# Patient Record
Sex: Male | Born: 1940 | Race: White | Hispanic: No | Smoking: Current every day smoker
Health system: Southern US, Community
[De-identification: ages and names within clinical notes are randomized; demographics above are authoritative.]

## PROBLEM LIST (undated history)

## (undated) DIAGNOSIS — Z87448 Personal history of other diseases of urinary system: Secondary | ICD-10-CM

## (undated) DIAGNOSIS — Z789 Other specified health status: Secondary | ICD-10-CM

## (undated) DIAGNOSIS — I10 Essential (primary) hypertension: Secondary | ICD-10-CM

## (undated) DIAGNOSIS — N200 Calculus of kidney: Secondary | ICD-10-CM

## (undated) DIAGNOSIS — Z8781 Personal history of (healed) traumatic fracture: Secondary | ICD-10-CM

## (undated) DIAGNOSIS — G8929 Other chronic pain: Secondary | ICD-10-CM

## (undated) DIAGNOSIS — M549 Dorsalgia, unspecified: Secondary | ICD-10-CM

## (undated) DIAGNOSIS — J449 Chronic obstructive pulmonary disease, unspecified: Secondary | ICD-10-CM

## (undated) HISTORY — PX: OTHER SURGICAL HISTORY: SHX169

---

## 2002-03-23 ENCOUNTER — Ambulatory Visit (HOSPITAL_BASED_OUTPATIENT_CLINIC_OR_DEPARTMENT_OTHER): Admission: RE | Admit: 2002-03-23 | Discharge: 2002-03-23 | Payer: Self-pay | Admitting: Orthopedic Surgery

## 2013-01-04 ENCOUNTER — Emergency Department (HOSPITAL_BASED_OUTPATIENT_CLINIC_OR_DEPARTMENT_OTHER): Payer: Medicare Other

## 2013-01-04 ENCOUNTER — Emergency Department (HOSPITAL_BASED_OUTPATIENT_CLINIC_OR_DEPARTMENT_OTHER)
Admission: EM | Admit: 2013-01-04 | Discharge: 2013-01-04 | Discharge status: Against Medical Advice | Payer: Medicare Other | Attending: Emergency Medicine | Admitting: Emergency Medicine

## 2013-01-04 ENCOUNTER — Encounter (HOSPITAL_BASED_OUTPATIENT_CLINIC_OR_DEPARTMENT_OTHER): Payer: Self-pay | Admitting: *Deleted

## 2013-01-04 DIAGNOSIS — M549 Dorsalgia, unspecified: Secondary | ICD-10-CM | POA: Insufficient documentation

## 2013-01-04 DIAGNOSIS — R7401 Elevation of levels of liver transaminase levels: Secondary | ICD-10-CM | POA: Insufficient documentation

## 2013-01-04 DIAGNOSIS — F17200 Nicotine dependence, unspecified, uncomplicated: Secondary | ICD-10-CM | POA: Insufficient documentation

## 2013-01-04 DIAGNOSIS — Z8781 Personal history of (healed) traumatic fracture: Secondary | ICD-10-CM | POA: Insufficient documentation

## 2013-01-04 DIAGNOSIS — J449 Chronic obstructive pulmonary disease, unspecified: Secondary | ICD-10-CM | POA: Insufficient documentation

## 2013-01-04 DIAGNOSIS — Z87448 Personal history of other diseases of urinary system: Secondary | ICD-10-CM | POA: Insufficient documentation

## 2013-01-04 DIAGNOSIS — G8929 Other chronic pain: Secondary | ICD-10-CM | POA: Insufficient documentation

## 2013-01-04 DIAGNOSIS — I10 Essential (primary) hypertension: Secondary | ICD-10-CM | POA: Insufficient documentation

## 2013-01-04 DIAGNOSIS — R9431 Abnormal electrocardiogram [ECG] [EKG]: Secondary | ICD-10-CM

## 2013-01-04 DIAGNOSIS — R748 Abnormal levels of other serum enzymes: Secondary | ICD-10-CM

## 2013-01-04 DIAGNOSIS — R7989 Other specified abnormal findings of blood chemistry: Secondary | ICD-10-CM

## 2013-01-04 DIAGNOSIS — Z87442 Personal history of urinary calculi: Secondary | ICD-10-CM | POA: Insufficient documentation

## 2013-01-04 DIAGNOSIS — R7402 Elevation of levels of lactic acid dehydrogenase (LDH): Secondary | ICD-10-CM | POA: Insufficient documentation

## 2013-01-04 DIAGNOSIS — J4489 Other specified chronic obstructive pulmonary disease: Secondary | ICD-10-CM | POA: Insufficient documentation

## 2013-01-04 DIAGNOSIS — I959 Hypotension, unspecified: Secondary | ICD-10-CM

## 2013-01-04 HISTORY — DX: Other chronic pain: G89.29

## 2013-01-04 HISTORY — DX: Personal history of other diseases of urinary system: Z87.448

## 2013-01-04 HISTORY — DX: Personal history of (healed) traumatic fracture: Z87.81

## 2013-01-04 HISTORY — DX: Essential (primary) hypertension: I10

## 2013-01-04 HISTORY — DX: Chronic obstructive pulmonary disease, unspecified: J44.9

## 2013-01-04 HISTORY — DX: Dorsalgia, unspecified: M54.9

## 2013-01-04 HISTORY — DX: Calculus of kidney: N20.0

## 2013-01-04 HISTORY — DX: Other specified health status: Z78.9

## 2013-01-04 LAB — URINALYSIS, ROUTINE W REFLEX MICROSCOPIC
Bilirubin Urine: NEGATIVE
Hgb urine dipstick: NEGATIVE
Protein, ur: NEGATIVE mg/dL
Urobilinogen, UA: 1 mg/dL (ref 0.0–1.0)

## 2013-01-04 LAB — CBC WITH DIFFERENTIAL/PLATELET
HCT: 35.4 % — ABNORMAL LOW (ref 39.0–52.0)
Hemoglobin: 12.9 g/dL — ABNORMAL LOW (ref 13.0–17.0)
Lymphocytes Relative: 11 % — ABNORMAL LOW (ref 12–46)
Lymphs Abs: 0.7 10*3/uL (ref 0.7–4.0)
Monocytes Absolute: 0.7 10*3/uL (ref 0.1–1.0)
Monocytes Relative: 11 % (ref 3–12)
Neutro Abs: 4.8 10*3/uL (ref 1.7–7.7)
Neutrophils Relative %: 77 % (ref 43–77)
RBC: 3.51 MIL/uL — ABNORMAL LOW (ref 4.22–5.81)

## 2013-01-04 LAB — COMPREHENSIVE METABOLIC PANEL
Albumin: 3 g/dL — ABNORMAL LOW (ref 3.5–5.2)
Alkaline Phosphatase: 298 U/L — ABNORMAL HIGH (ref 39–117)
BUN: 13 mg/dL (ref 6–23)
CO2: 18 mEq/L — ABNORMAL LOW (ref 19–32)
Chloride: 107 mEq/L (ref 96–112)
GFR calc non Af Amer: 88 mL/min — ABNORMAL LOW (ref >90)
Glucose, Bld: 79 mg/dL (ref 70–99)
Potassium: 3.6 mEq/L (ref 3.5–5.1)
Total Bilirubin: 2.1 mg/dL — ABNORMAL HIGH (ref 0.3–1.2)

## 2013-01-04 LAB — POCT I-STAT 3, ART BLOOD GAS (G3+)
Acid-base deficit: 6 mmol/L — ABNORMAL HIGH (ref 0.0–2.0)
pCO2 arterial: 33 mmHg — ABNORMAL LOW (ref 35.0–45.0)
pO2, Arterial: 108 mmHg — ABNORMAL HIGH (ref 80.0–100.0)

## 2013-01-04 LAB — URINE MICROSCOPIC-ADD ON

## 2013-01-04 LAB — PROTIME-INR: Prothrombin Time: 13.2 seconds (ref 11.6–15.2)

## 2013-01-04 LAB — RAPID URINE DRUG SCREEN, HOSP PERFORMED
Opiates: NOT DETECTED
Tetrahydrocannabinol: NOT DETECTED

## 2013-01-04 LAB — LACTIC ACID, PLASMA: Lactic Acid, Venous: 4.4 mmol/L — ABNORMAL HIGH (ref 0.5–2.2)

## 2013-01-04 LAB — GLUCOSE, CAPILLARY: Glucose-Capillary: 79 mg/dL (ref 70–99)

## 2013-01-04 MED ORDER — SODIUM CHLORIDE 0.9 % IV BOLUS (SEPSIS)
1000.0000 mL | Freq: Once | INTRAVENOUS | Status: AC
  Administered 2013-01-04: 1000 mL via INTRAVENOUS

## 2013-01-04 MED ORDER — SODIUM CHLORIDE 0.9 % IV SOLN
1000.0000 mL | Freq: Once | INTRAVENOUS | Status: AC
  Administered 2013-01-04: 1000 mL via INTRAVENOUS

## 2013-01-04 MED ORDER — VANCOMYCIN HCL IN DEXTROSE 1-5 GM/200ML-% IV SOLN
INTRAVENOUS | Status: AC
  Administered 2013-01-04: 1 g
  Filled 2013-01-04: qty 200

## 2013-01-04 MED ORDER — VANCOMYCIN HCL 10 G IV SOLR
1.0000 g | Freq: Once | INTRAVENOUS | Status: DC
  Filled 2013-01-04: qty 1000

## 2013-01-04 MED ORDER — PIPERACILLIN-TAZOBACTAM 3.375 G IVPB
3.3750 g | Freq: Once | INTRAVENOUS | Status: AC
  Administered 2013-01-04: 3.375 g via INTRAVENOUS
  Filled 2013-01-04: qty 50

## 2013-01-04 NOTE — ED Notes (Signed)
Pt. Refuses to stay in hosp and does not want to be admitted.  Pt. Reports he will sign out AMA and go home.

## 2013-01-04 NOTE — ED Notes (Signed)
Pt. Reports to RN Earlene Plater he will accept the IV Abx. But he will sign out AMA when the Abx. Infusion is finished.

## 2013-01-04 NOTE — ED Notes (Signed)
Cardiac monitor NSR

## 2013-01-04 NOTE — ED Notes (Signed)
Pt to ED via EMS with hypotension BP 74/ . On arrival he is alert oriented, warm and dry. Cardiac monitor NSR. Oxygen 3L/M on arrival. Changed to 1.5 liters on arrival. Wife states at home he got up and slumped over and seemed to be unconscious. Hx of rib fracture 3 months ago.

## 2013-01-04 NOTE — ED Notes (Signed)
Pt. Able to drink H2O with no difficulty and is resting while antibiotics infuse.

## 2013-01-04 NOTE — ED Notes (Signed)
Oxygen 1.5 L/M [place]

## 2013-01-04 NOTE — ED Notes (Signed)
Unable to Link Vancomycin that was pulled by [employer].  Med was ordered by Dr. Rosalia Hammers and was acknowledged but did not show up in the Pyxis.

## 2013-01-04 NOTE — ED Provider Notes (Signed)
History     CSN: [id number]  Arrival date & time 01/04/13  1243   First MD Initiated Contact with Patient 01/04/13 1246      Chief Complaint  Patient presents with  . Hypotension    (Consider location/radiation/quality/duration/timing/severity/associated sxs/prior treatment) HPI Patient with syncopal episode this a.m. Sitting at the table.  Slumped over table and unresponsive to voice for 5 minutes.  Wife called ems and ems arrived and reported bp at 80/40.  Patient awake and alert.  He reports that he took zanaflex this a.m. And a pain pill earlier.  He denies headache, head injury, chest pain, cough, dyspnea, abdominal pain, nausea, vomiting, or diarrhea, fever, or chills.  He has chronic back pain that he took the zanaflex and vicodin for.   Past Medical History  Diagnosis Date  . COPD (chronic obstructive pulmonary disease)   . Hypertension   . Chronic back pain   . H/O bladder problems   . Kidney stones   . History of rib fracture   . Self-catheterizes urinary bladder     self caths    Past Surgical History  Procedure Laterality Date  . Bladder problem surgery      No family history on file.  History  Substance Use Topics  . Smoking status: Current Every Day Smoker -- 1.50 packs/day    Types: Cigarettes  . Smokeless tobacco: Not on file  . Alcohol Use: Yes     Comment: drinks several alcoholic drinks at night      Review of Systems  All other systems reviewed and are negative.    Allergies  Sulfa antibiotics  Home Medications   Current Outpatient Rx  Name  Route  Sig  Dispense  Refill  . Hydrocodone-Acetaminophen (VICODIN PO)   Oral   Take by mouth.         . losartan (COZAAR) 100 MG tablet   Oral   Take 100 mg by mouth daily.         . simvastatin (ZOCOR) 40 MG tablet   Oral   Take 40 mg by mouth every evening.           BP 89/50  Pulse 65  Temp(Src) 98.6 F (37 C) (Rectal)  Resp 16  SpO2 98%  Physical Exam  Nursing note and  vitals reviewed. Constitutional: He is oriented to person, place, and time. He appears well-developed and well-nourished.  HENT:  Head: Normocephalic.  Right Ear: External ear normal.  Left Ear: External ear normal.  Nose: Nose normal.  Mucous membranes dry  Eyes: Conjunctivae are normal. Pupils are equal, round, and reactive to light.  Neck: Normal range of motion. Neck supple.  Cardiovascular: Normal rate, regular rhythm and normal heart sounds.   Pulmonary/Chest: Effort normal and breath sounds normal.  Abdominal: Soft. Bowel sounds are normal.  Musculoskeletal: Normal range of motion.  Neurological: He is alert and oriented to person, place, and time. He has normal reflexes.  Skin: Skin is warm and dry.  Psychiatric: He has a normal mood and affect. His behavior is normal. Judgment and thought content normal.    ED Course  Procedures (including critical care time)  Labs Reviewed  CBC WITH DIFFERENTIAL - Abnormal; Notable for the following:    RBC 3.51 (*)    Hemoglobin 12.9 (*)    HCT 35.4 (*)    MCV 100.9 (*)    MCH 36.8 (*)    MCHC 36.4 (*)    Lymphocytes Relative 11 (*)  All other components within normal limits  COMPREHENSIVE METABOLIC PANEL - Abnormal; Notable for the following:    CO2 18 (*)    Albumin 3.0 (*)    AST 550 (*)    ALT 192 (*)    Alkaline Phosphatase 298 (*)    Total Bilirubin 2.1 (*)    GFR calc non Af Amer 88 (*)    All other components within normal limits  ETHANOL - Abnormal; Notable for the following:    Alcohol, Ethyl (B) 123 (*)    All other components within normal limits  LACTIC ACID, PLASMA - Abnormal; Notable for the following:    Lactic Acid, Venous 4.4 (*)    All other components within normal limits  POCT I-STAT 3, BLOOD GAS (G3+) - Abnormal; Notable for the following:    pCO2 arterial 33.0 (*)    pO2, Arterial 108.0 (*)    Bicarbonate 18.4 (*)    Acid-base deficit 6.0 (*)    All other components within normal limits   CULTURE, BLOOD (ROUTINE X 2)  CULTURE, BLOOD (ROUTINE X 2)  AMMONIA  PROTIME-INR  GLUCOSE, CAPILLARY  URINE RAPID DRUG SCREEN (HOSP PERFORMED)  URINALYSIS, ROUTINE W REFLEX MICROSCOPIC  TROPONIN I   No results found.   No diagnosis found.  Results for orders placed during the hospital encounter of 01/04/13  CBC WITH DIFFERENTIAL      Result Value Range   WBC 6.2  4.0 - 10.5 K/uL   RBC 3.51 (*) 4.22 - 5.81 MIL/uL   Hemoglobin 12.9 (*) 13.0 - 17.0 g/dL   HCT 62.1 (*) 30.8 - 65.7 %   MCV 100.9 (*) 78.0 - 100.0 fL   MCH 36.8 (*) 26.0 - 34.0 pg   MCHC 36.4 (*) 30.0 - 36.0 g/dL   RDW 84.6  96.2 - 95.2 %   Platelets 202  150 - 400 K/uL   Neutrophils Relative 77  43 - 77 %   Neutro Abs 4.8  1.7 - 7.7 K/uL   Lymphocytes Relative 11 (*) 12 - 46 %   Lymphs Abs 0.7  0.7 - 4.0 K/uL   Monocytes Relative 11  3 - 12 %   Monocytes Absolute 0.7  0.1 - 1.0 K/uL   Eosinophils Relative 1  0 - 5 %   Eosinophils Absolute 0.1  0.0 - 0.7 K/uL   Basophils Relative 1  0 - 1 %   Basophils Absolute 0.0  0.0 - 0.1 K/uL  AMMONIA      Result Value Range   Ammonia 45  11 - 60 umol/L  COMPREHENSIVE METABOLIC PANEL      Result Value Range   Sodium 143  135 - 145 mEq/L   Potassium 3.6  3.5 - 5.1 mEq/L   Chloride 107  96 - 112 mEq/L   CO2 18 (*) 19 - 32 mEq/L   Glucose, Bld 79  70 - 99 mg/dL   BUN 13  6 - 23 mg/dL   Creatinine, Ser 8.41  0.50 - 1.35 mg/dL   Calcium 8.9  8.4 - 32.4 mg/dL   Total Protein 6.2  6.0 - 8.3 g/dL   Albumin 3.0 (*) 3.5 - 5.2 g/dL   AST 401 (*) 0 - 37 U/L   ALT 192 (*) 0 - 53 U/L   Alkaline Phosphatase 298 (*) 39 - 117 U/L   Total Bilirubin 2.1 (*) 0.3 - 1.2 mg/dL   GFR calc non Af Amer 88 (*) >90 mL/min   GFR  calc Af Amer >90  >90 mL/min  ETHANOL      Result Value Range   Alcohol, Ethyl (B) 123 (*) 0 - 11 mg/dL  LACTIC ACID, PLASMA      Result Value Range   Lactic Acid, Venous 4.4 (*) 0.5 - 2.2 mmol/L  PROTIME-INR      Result Value Range   Prothrombin Time 13.2   11.6 - 15.2 seconds   INR 1.01  0.00 - 1.49  GLUCOSE, CAPILLARY      Result Value Range   Glucose-Capillary 79  70 - 99 mg/dL  POCT I-STAT 3, BLOOD GAS (G3+)      Result Value Range   pH, Arterial 7.354  7.350 - 7.450   pCO2 arterial 33.0 (*) 35.0 - 45.0 mmHg   pO2, Arterial 108.0 (*) 80.0 - 100.0 mmHg   Bicarbonate 18.4 (*) 20.0 - 24.0 mEq/L   TCO2 19  0 - 100 mmol/L   O2 Saturation 98.0     Acid-base deficit 6.0 (*) 0.0 - 2.0 mmol/L   Patient temperature 98.6 F     Collection site RADIAL, ALLEN'S TEST ACCEPTABLE     Drawn by Operator     Sample type ARTERIAL      Date: 01/04/2013  Rate: 66  Rhythm: nsr   QRS Axis: normal  Intervals: normal  ST/T Wave abnormalities: t wave abnormality III, avn avf, v4 and v5  Conduction Disutrbances:none  Narrative Interpretation:   Old EKG Reviewed: none available    MDM   Patient with hypotension corrected with one liter ns and second liter started.  Patient with elevated lactate, alcohol, lfts, and abnormal lactic acid.  Patient advised admission to assess for sepsis.  Zosyn and vancomycin ordered.  Patient refuses admission.  Discussed with patient severity of illness and need for hospitalization.  Patient continues to refuse.  Wife present and also states he will not stay.   CRITICAL CARE Performed by: Hilario Quarry   Total critical care time: 45  Critical care time was exclusive of separately billable procedures and treating other patients.  Critical care was necessary to treat or prevent imminent or life-threatening deterioration.  Critical care was time spent personally by me on the following activities: development of treatment plan with patient and/or surrogate as well as nursing, discussions with consultants, evaluation of patient's response to treatment, examination of patient, obtaining history from patient or surrogate, ordering and performing treatments and interventions, ordering and review of laboratory studies,  ordering and review of radiographic studies, pulse oximetry and re-evaluation of patient's condition.  Date: 01/04/2013 Patient: Bobby Dougherty Admitted: 01/04/2013 12:43 PM Attending Provider: Hilario Quarry, MD  Maricela Curet or his authorized caregiver has made the decision for the patient to leave the emergency department against the advice of Hilario Quarry, MD.  He or his authorized caregiver has been informed and understands the inherent risks, including death.  He or his authorized caregiver has decided to accept the responsibility for this decision. Maricela Curet and all necessary parties have been advised that he may return for further evaluation or treatment. His condition at time of discharge was Stable.  Maricela Curet had current vital signs as follows:  Blood pressure 121/73, pulse 65, temperature 98.6 F (37 C), temperature source Rectal, resp. rate 18, height 5\' 9"  (1.753 m), weight 160 lb (72.576 kg), SpO2 97.00%.   Maricela Curet or his authorized caregiver has signed the Leaving Against Medical Advice form prior to leaving the department.  Aleyda Gindlesperger  S 01/04/2013         Hilario Quarry, MD 01/04/13 1432

## 2013-01-06 LAB — URINE CULTURE: Colony Count: >=100000

## 2013-01-07 ENCOUNTER — Telehealth (HOSPITAL_COMMUNITY): Payer: Self-pay | Admitting: Emergency Medicine

## 2013-01-07 NOTE — ED Notes (Signed)
Patient has +Urine culture. Checking to see if appropriately treated. °

## 2013-01-07 NOTE — ED Notes (Signed)
+   Urine Chart sent to EDP office for review. 

## 2013-01-09 NOTE — ED Notes (Signed)
Wife(caretaker) informed of positive results  And she will have him f/u with PCP.

## 2013-01-09 NOTE — ED Notes (Signed)
Chart returned from EDP office with rx for Keflex 500 mg tid x 7 days needs to be called to pharmacy per Rhea Bleacher.

## 2013-01-10 LAB — CULTURE, BLOOD (ROUTINE X 2)
Culture: NO GROWTH
Culture: NO GROWTH

## 2014-03-29 DIAGNOSIS — K219 Gastro-esophageal reflux disease without esophagitis: Secondary | ICD-10-CM | POA: Insufficient documentation

## 2014-03-29 DIAGNOSIS — Z86010 Personal history of colonic polyps: Secondary | ICD-10-CM | POA: Insufficient documentation

## 2014-03-29 DIAGNOSIS — K5730 Diverticulosis of large intestine without perforation or abscess without bleeding: Secondary | ICD-10-CM | POA: Insufficient documentation

## 2014-03-29 DIAGNOSIS — M5136 Other intervertebral disc degeneration, lumbar region: Secondary | ICD-10-CM | POA: Insufficient documentation

## 2014-03-29 DIAGNOSIS — K700 Alcoholic fatty liver: Secondary | ICD-10-CM | POA: Insufficient documentation

## 2014-03-29 DIAGNOSIS — N529 Male erectile dysfunction, unspecified: Secondary | ICD-10-CM | POA: Insufficient documentation

## 2014-03-29 DIAGNOSIS — R948 Abnormal results of function studies of other organs and systems: Secondary | ICD-10-CM | POA: Insufficient documentation

## 2014-03-29 DIAGNOSIS — N200 Calculus of kidney: Secondary | ICD-10-CM | POA: Insufficient documentation

## 2014-03-29 DIAGNOSIS — M810 Age-related osteoporosis without current pathological fracture: Secondary | ICD-10-CM | POA: Insufficient documentation

## 2014-03-29 DIAGNOSIS — G4700 Insomnia, unspecified: Secondary | ICD-10-CM | POA: Insufficient documentation

## 2014-03-29 DIAGNOSIS — E785 Hyperlipidemia, unspecified: Secondary | ICD-10-CM | POA: Insufficient documentation

## 2014-03-29 DIAGNOSIS — K811 Chronic cholecystitis: Secondary | ICD-10-CM | POA: Insufficient documentation

## 2014-03-29 DIAGNOSIS — J309 Allergic rhinitis, unspecified: Secondary | ICD-10-CM | POA: Insufficient documentation

## 2014-03-29 DIAGNOSIS — N319 Neuromuscular dysfunction of bladder, unspecified: Secondary | ICD-10-CM | POA: Insufficient documentation

## 2014-03-29 DIAGNOSIS — I719 Aortic aneurysm of unspecified site, without rupture: Secondary | ICD-10-CM | POA: Insufficient documentation

## 2014-03-29 DIAGNOSIS — M5430 Sciatica, unspecified side: Secondary | ICD-10-CM | POA: Insufficient documentation

## 2014-03-29 DIAGNOSIS — R7303 Prediabetes: Secondary | ICD-10-CM | POA: Insufficient documentation

## 2014-03-29 DIAGNOSIS — J449 Chronic obstructive pulmonary disease, unspecified: Secondary | ICD-10-CM | POA: Insufficient documentation

## 2014-04-29 DIAGNOSIS — R9431 Abnormal electrocardiogram [ECG] [EKG]: Secondary | ICD-10-CM | POA: Insufficient documentation

## 2014-05-13 DIAGNOSIS — N35919 Unspecified urethral stricture, male, unspecified site: Secondary | ICD-10-CM | POA: Insufficient documentation

## 2014-06-18 DIAGNOSIS — S32010A Wedge compression fracture of first lumbar vertebra, initial encounter for closed fracture: Secondary | ICD-10-CM | POA: Insufficient documentation

## 2014-06-18 DIAGNOSIS — S22080A Wedge compression fracture of T11-T12 vertebra, initial encounter for closed fracture: Secondary | ICD-10-CM | POA: Insufficient documentation

## 2014-11-27 IMAGING — CT CT HEAD W/O CM
2 series · 16 of 30 positions shown, 18 images · non-contrast
Comparison: None.

CLINICAL DATA: Hypotension.

CT HEAD WITHOUT CONTRAST
TECHNIQUE: Contiguous axial images were obtained from the base of
the skull through the vertex without contrast.

[Series 2: head 4.8 h37s · axial · 0.49mm/px · z∈[+1420,+1557]mm · 8 of 36 slices shown, 10 images]
[im 4/36  brain]
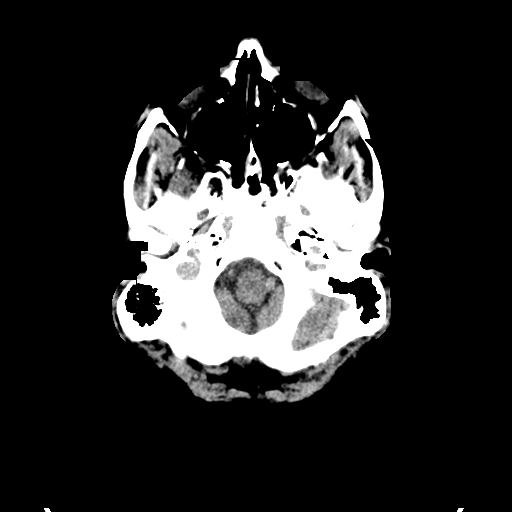
[im 4/36  bone]
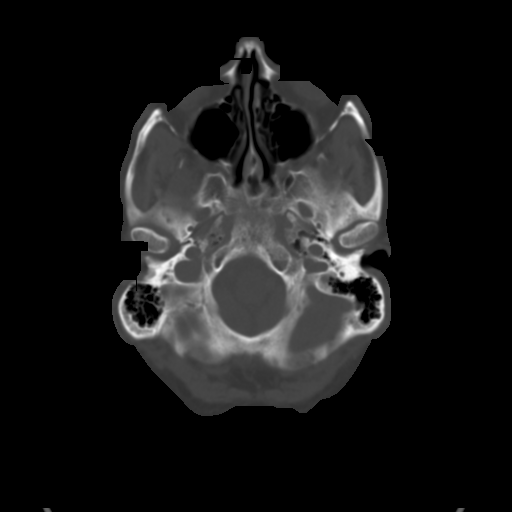
[im 8/36  brain]
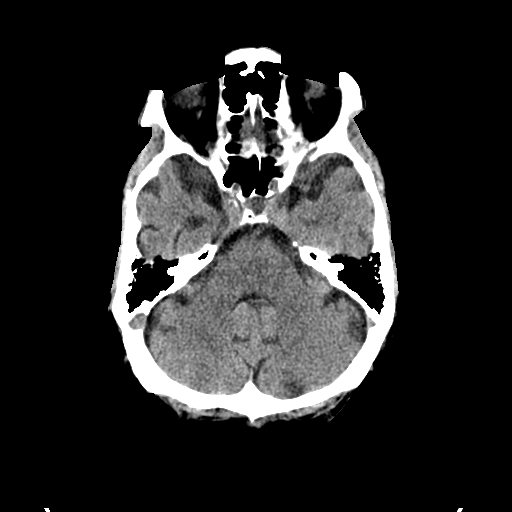
[im 12/36  brain]
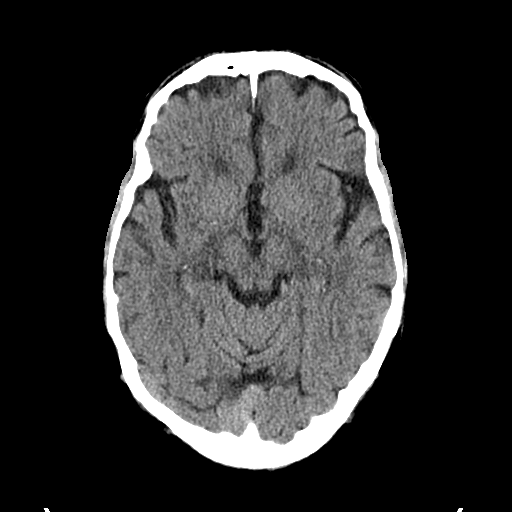
[im 16/36  brain]
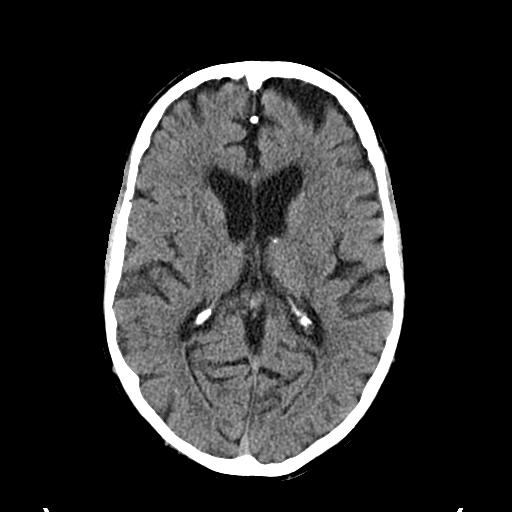
[im 20/36  brain]
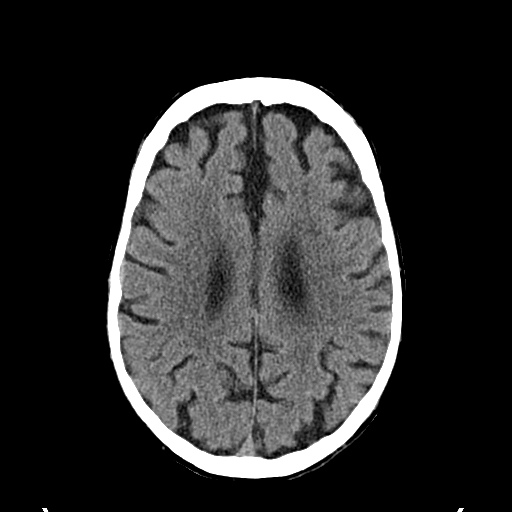
[im 20/36  bone]
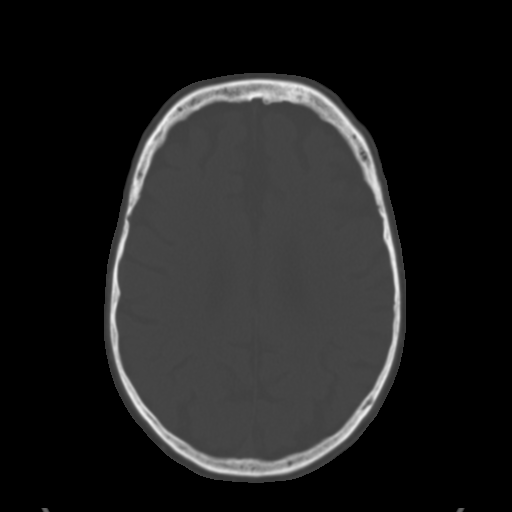
[im 24/36  brain]
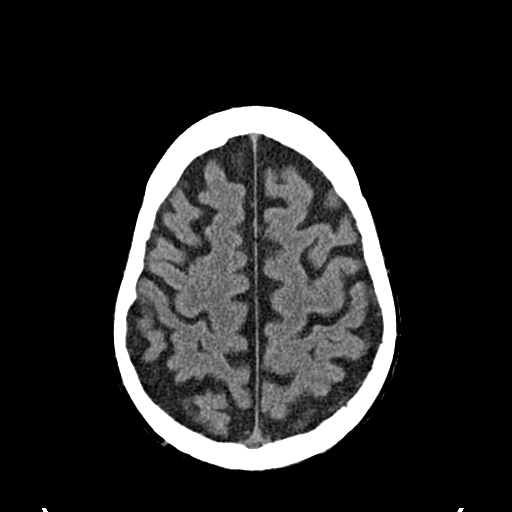
[im 28/36  brain]
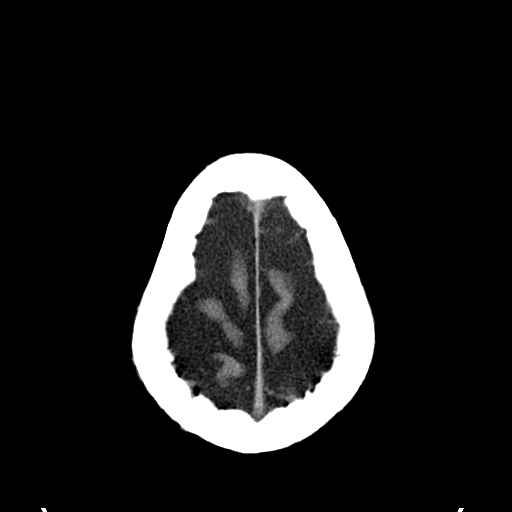
[im 32/36  brain]
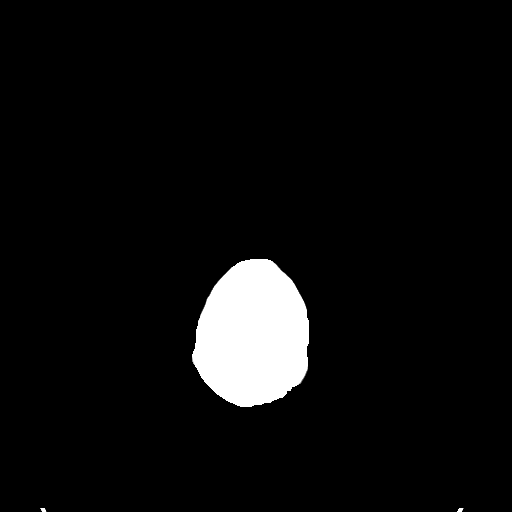

[Series 3: head 2.4 h60s bone · axial · 0.49mm/px · z∈[+1421,+1558]mm · 8 of 72 slices shown]
[im 8/72  bone]
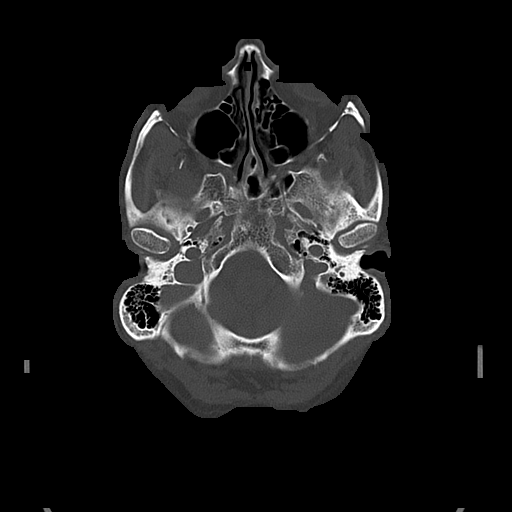
[im 15/72  bone]
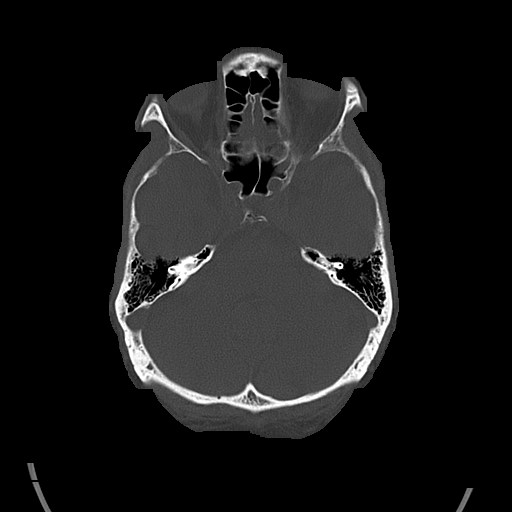
[im 23/72  bone]
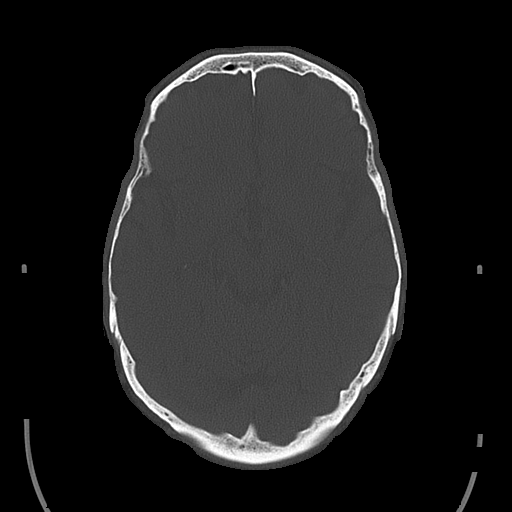
[im 30/72  bone]
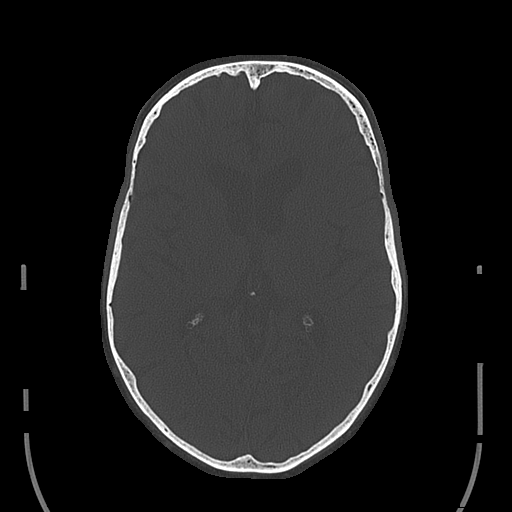
[im 42/72  bone]
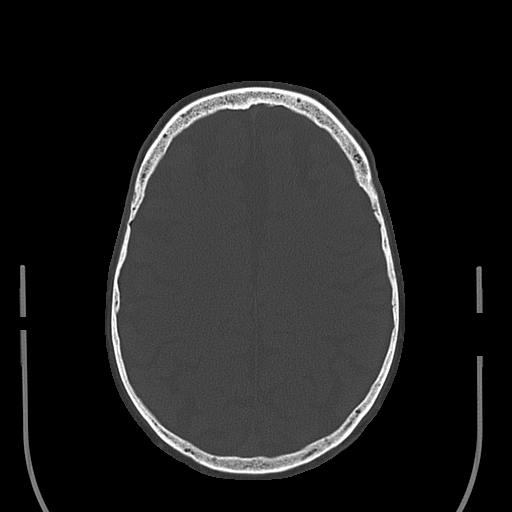
[im 49/72  bone]
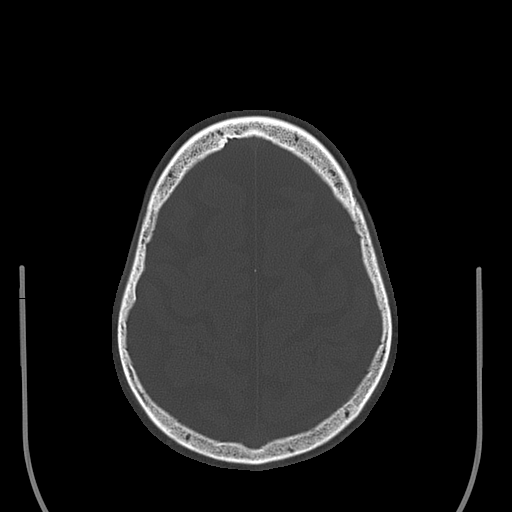
[im 57/72  bone]
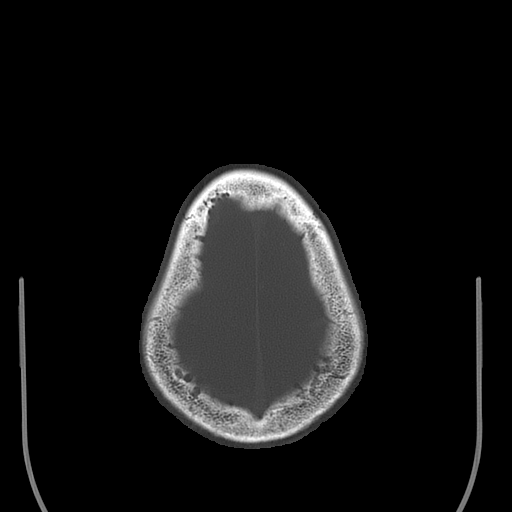
[im 64/72  bone]
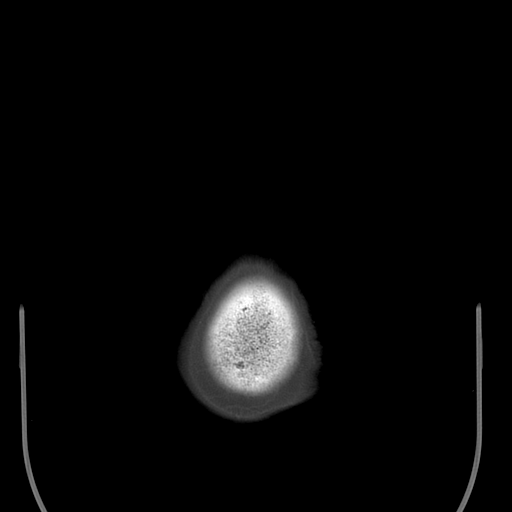

[16 of 30 positions shown; findings below may reference images not displayed]

FINDINGS: There is  low attenuation within the subcortical and
periventricular white matter consistent with chronic small vessel
ischemic change. Several small areas of low attenuation within the
right cerebellar hemisphere noted consistent with old infarcts.
There is prominence of the sulci and ventricles consistent with
brain atrophy.  Chronic bilateral basal ganglia calcifications
noted.  There is no evidence for acute brain infarct, hemorrhage or
mass.  The calvarium is intact.  The paranasal sinuses and mastoid
air cells are clear.
IMPRESSION: 1.  No acute intracranial abnormalities.
2.  Small vessel ischemic change and brain atrophy.

## 2014-11-27 IMAGING — CR DG CHEST 2V
1 series · 1 of 1 positions shown · non-contrast
Comparison: 02/10/2010

CLINICAL DATA: Hypotension

CHEST - 2 VIEW

[view not recorded]
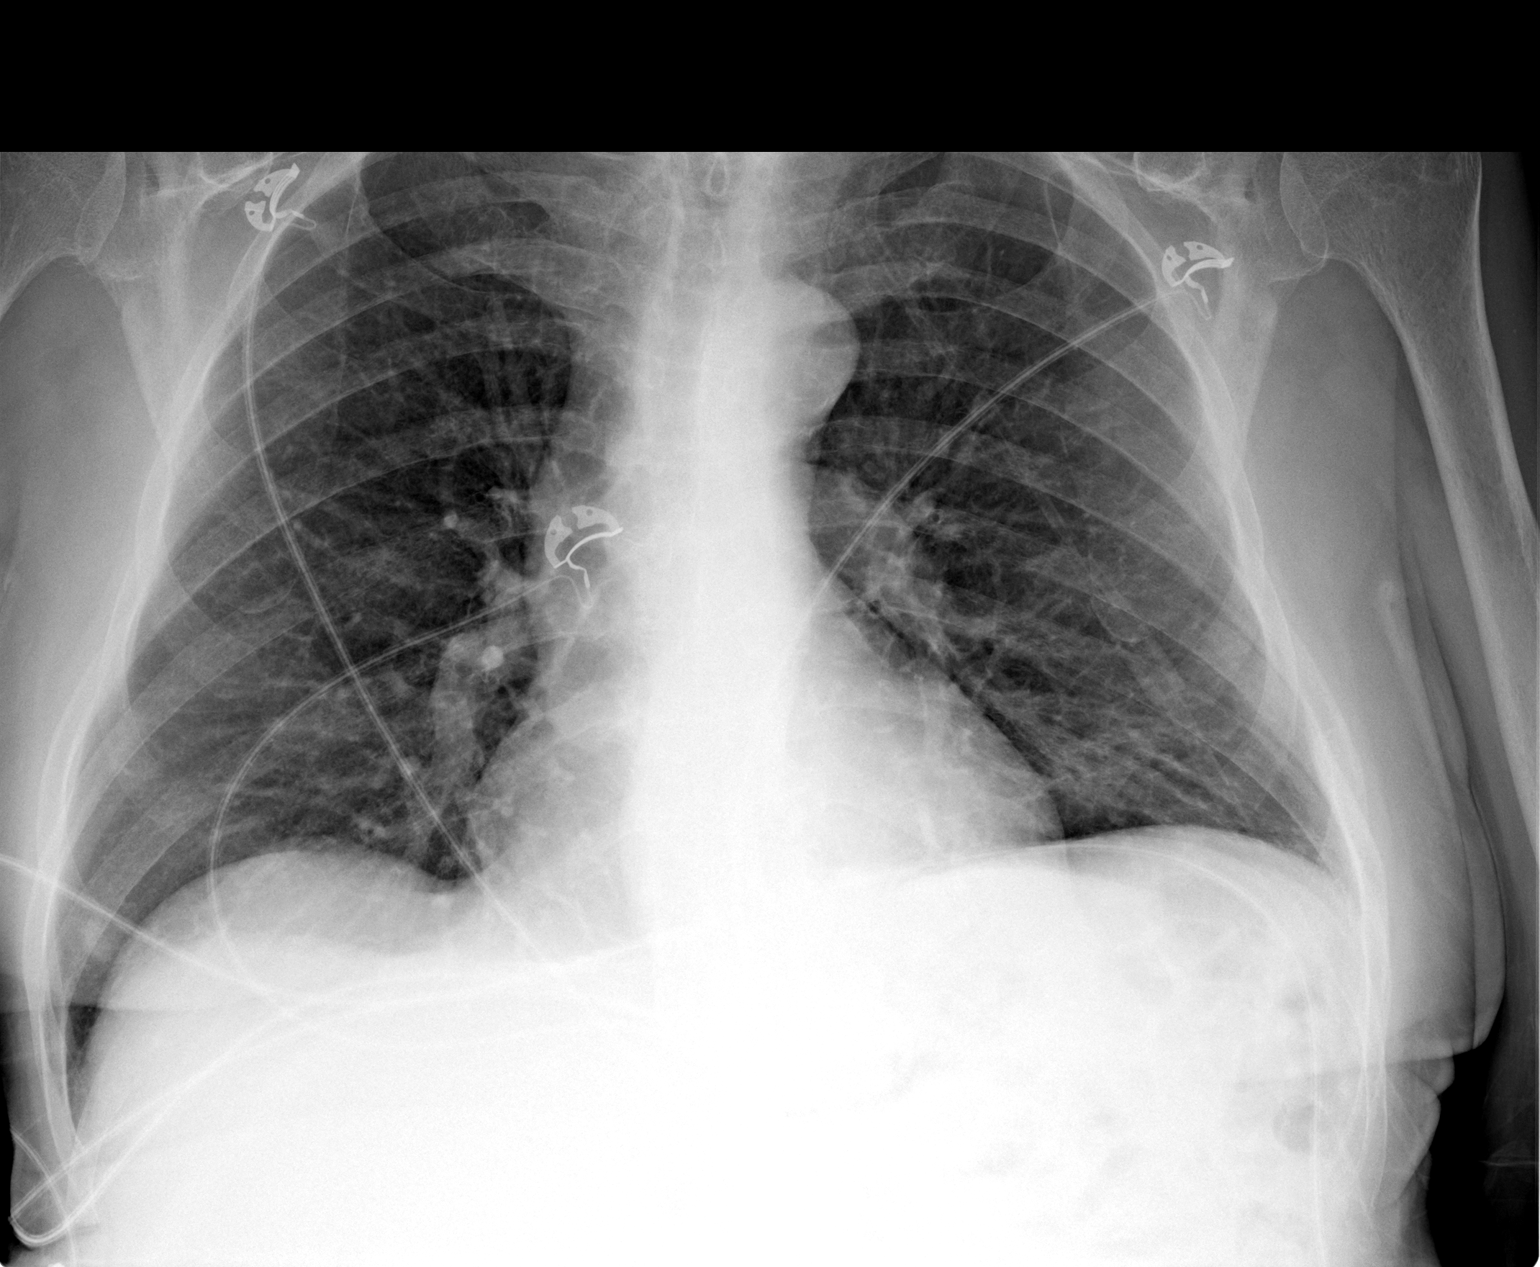

[1 of 1 positions shown; findings below may reference images not displayed]

FINDINGS: Cardiomediastinal silhouette is unremarkable.  No acute
infiltrate or pleural effusion.  No pulmonary edema.  Mild
elevation of the left hemidiaphragm.
IMPRESSION: No active disease.

## 2017-04-06 DIAGNOSIS — I6522 Occlusion and stenosis of left carotid artery: Secondary | ICD-10-CM | POA: Insufficient documentation

## 2017-09-28 DIAGNOSIS — I480 Paroxysmal atrial fibrillation: Secondary | ICD-10-CM | POA: Insufficient documentation

## 2017-09-28 DIAGNOSIS — E7800 Pure hypercholesterolemia, unspecified: Secondary | ICD-10-CM | POA: Insufficient documentation

## 2017-09-28 DIAGNOSIS — H3410 Central retinal artery occlusion, unspecified eye: Secondary | ICD-10-CM | POA: Insufficient documentation

## 2018-01-13 DIAGNOSIS — K1120 Sialoadenitis, unspecified: Secondary | ICD-10-CM | POA: Insufficient documentation

## 2018-06-28 DIAGNOSIS — S32030A Wedge compression fracture of third lumbar vertebra, initial encounter for closed fracture: Secondary | ICD-10-CM | POA: Insufficient documentation

## 2018-08-29 DIAGNOSIS — M48061 Spinal stenosis, lumbar region without neurogenic claudication: Secondary | ICD-10-CM | POA: Insufficient documentation

## 2018-10-16 DIAGNOSIS — N183 Chronic kidney disease, stage 3 unspecified: Secondary | ICD-10-CM | POA: Insufficient documentation

## 2018-10-16 DIAGNOSIS — K279 Peptic ulcer, site unspecified, unspecified as acute or chronic, without hemorrhage or perforation: Secondary | ICD-10-CM | POA: Insufficient documentation

## 2018-10-16 DIAGNOSIS — R29818 Other symptoms and signs involving the nervous system: Secondary | ICD-10-CM | POA: Insufficient documentation

## 2018-10-16 DIAGNOSIS — F17200 Nicotine dependence, unspecified, uncomplicated: Secondary | ICD-10-CM | POA: Insufficient documentation

## 2019-01-23 DIAGNOSIS — Z789 Other specified health status: Secondary | ICD-10-CM | POA: Insufficient documentation

## 2019-04-25 DIAGNOSIS — R4189 Other symptoms and signs involving cognitive functions and awareness: Secondary | ICD-10-CM | POA: Insufficient documentation

## 2020-03-31 ENCOUNTER — Encounter: Payer: Self-pay | Admitting: Internal Medicine

## 2020-03-31 ENCOUNTER — Other Ambulatory Visit: Payer: Self-pay | Admitting: Internal Medicine

## 2020-03-31 ENCOUNTER — Non-Acute Institutional Stay (SKILLED_NURSING_FACILITY): Payer: Self-pay | Admitting: Internal Medicine

## 2020-03-31 DIAGNOSIS — I10 Essential (primary) hypertension: Secondary | ICD-10-CM

## 2020-03-31 DIAGNOSIS — Z8679 Personal history of other diseases of the circulatory system: Secondary | ICD-10-CM

## 2020-03-31 DIAGNOSIS — M25561 Pain in right knee: Secondary | ICD-10-CM

## 2020-03-31 DIAGNOSIS — F0390 Unspecified dementia without behavioral disturbance: Secondary | ICD-10-CM

## 2020-03-31 DIAGNOSIS — D509 Iron deficiency anemia, unspecified: Secondary | ICD-10-CM

## 2020-03-31 DIAGNOSIS — Z8739 Personal history of other diseases of the musculoskeletal system and connective tissue: Secondary | ICD-10-CM

## 2020-03-31 MED ORDER — NICOTINE 21 MG/24HR TD PT24: 21.0000 mg | MEDICATED_PATCH | Freq: Every day | TRANSDERMAL | 0 refills | Status: DC

## 2020-03-31 NOTE — Progress Notes (Signed)
Location:    [facility] Room Number: 505/P Place of Service:  SNF (31) Provider:  Lorne Skeens  No primary care provider on file.  No care team member to display  Extended Emergency Contact Information Primary Emergency Contact: [facility] Address: [address]          [place], [place] [address] Bobby Dougherty of [place] Phone: [phone] Mobile Phone: [phone] Relation: Spouse  Code Status:  DNR Goals of care: Advanced Directive information Advanced Directives 03/31/2020  Does Patient Have a Medical Advance Directive? Yes  Type of Advance Directive Out of facility DNR (pink MOST or yellow form)  Does patient want to make changes to medical advance directive? No - Patient declined     Chief Complaint  Patient presents with  . Hospitalization Follow-up    Hospitalization Follow Up  Status post hospitalization for right knee pain causing limited mobility-with diagnosis of gouty arthritis  HPI:  Pt is a 79 y.o. male seen today for a hospital f/u s/p admission for right knee pain which caused significant immobility.  Work-up showed that patient most likely had gouty arthritis-and this was treated with colchicine which he has been discharged on 3 additional days.  In regards to other medical issues-these appear to be stable he does have a history of atrial fibrillation he is on Eliquis 5 mg twice daily for anticoagulation he is not on a rate limiting agent nonetheless this appears to be controlled.  He also has a history of anemia and continues on iron update CBC is pending hemoglobin was 12.6 on hospital lab May 6.  Regards to hypertension he is on losartan 100 mg daily blood pressure is 130/72 today-.  He does have a history of dementia and continues on Aricept 10 mg a day and Namenda 10 mg twice daily.  Per nursing his right knee does appear to be improved compared to what it looks like over the weekend-the edema is down  -he does not really complain of acute knee pain here although still has some tenderness when I touch the area    Past Medical History:  Diagnosis Date  . Chronic back pain   . COPD (chronic obstructive pulmonary disease) ([place])   . H/O bladder problems   . History of rib fracture   . Hypertension   . Kidney stones   . Self-catheterizes urinary bladder    self caths   Past Surgical History:  Procedure Laterality Date  . bladder problem surgery      Allergies  Allergen Reactions  . Sulfa Antibiotics Other (See Comments) and Cough    Low blood pressure Respiratory depression      . Ace Inhibitors Other (See Comments) and Cough    cough   . Other Other (See Comments)    Risk of hepatotoxicity..the patient abuses alcohol ( 01/25/2013 )    . 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 Sulfamethoxazole-Trimethoprim Cough  . Levofloxacin Cough    Allergies as of 03/31/2020      Reactions   Sulfa Antibiotics Other (See Comments), Cough   Low blood pressure Respiratory depression   Ace Inhibitors Other (See Comments), Cough   cough   Other Other (See Comments)   Risk of hepatotoxicity..the patient abuses alcohol ( 01/25/2013 )   Statins Other (See Comments), Cough   Risk of hepatotoxicity..the patient abuses alcohol ( 01/25/2013 )   Sulfamethoxazole-trimethoprim  Cough   Levofloxacin Cough      Medication List       Accurate as of Mar 31, 2020  4:22 PM. If you have any questions, ask your nurse or doctor.        STOP taking these medications   losartan 100 MG tablet Commonly known as: COZAAR Stopped by: Edmon Crape, PA-C   simvastatin 40 MG tablet Commonly known as: ZOCOR Stopped by: Edmon Crape, PA-C   VICODIN PO Stopped by: Edmon Crape, PA-C     TAKE these medications   colchicine 0.6 MG tablet Take 0.6 mg by mouth 2 (two) times daily.   donepezil 10 MG tablet Commonly known as:  ARICEPT Take 10 mg by mouth every evening. For Alzheimer's   Eliquis 5 MG Tabs tablet Generic drug: apixaban Take 5 mg by mouth 2 (two) times daily.   escitalopram 10 MG tablet Commonly known as: LEXAPRO Take 10 mg by mouth daily.   ezetimibe 10 MG tablet Commonly known as: ZETIA Take 10 mg by mouth daily. For hyperlipidemia   gabapentin 300 MG capsule Commonly known as: NEURONTIN Take 300 mg by mouth 2 (two) times daily.   iron polysaccharides 150 MG capsule Commonly known as: NIFEREX Take 150 mg by mouth daily.   Melatonin 10 MG Tabs Take 1 tablet by mouth at bedtime.   memantine 10 MG tablet Commonly known as: NAMENDA Take 10 mg by mouth 2 (two) times daily.   nicotine 21 mg/24hr patch Commonly known as: Nicoderm CQ Place 1 patch (21 mg total) onto the skin daily. For 21 mg 6 weeks, 14 mg for 2 weeks, 7 mg for 2 weeks   NON FORMULARY DIET:Regular diet Regular texture   pantoprazole 20 MG tablet Commonly known as: PROTONIX Take 20 mg by mouth daily.   Vitamin D-3 25 MCG (1000 UT) Caps Take 1 capsule by mouth daily. For Osteoporosis       Review of Systems   I In general is not complaining of any fever or chills.  Skin does not complain of rashes or itching he does have numerous seborrheic keratosis.  Head ears eyes nose mouth and throat does not complain of visual changes or sore throat.  Respiratory not complaining of being short of breath or having a cough.  Cardiac does not complain of chest pain or increasing edema.  GI is not complaining of abdominal pain nausea vomiting diarrhea constipation.  GU no complaints of dysuria.  Musculoskeletal does have a history of right knee gouty arthritis has some discomfort more with palpation but apparently this has improved.  Neurologic does not complain of dizziness headache or syncope.  Psych does have a history of dementia does not complain of being depressed or anxious I do note he is on  Lexapro.    Immunization History  Administered Date(s) Administered  . H1N1 12/12/2008  . Influenza Split 08/02/2011, 08/31/2012  . Influenza, High Dose Seasonal PF 08/16/2014, 09/06/2016, 08/14/2018, 07/09/2019  . Influenza, Seasonal, Injecte, Preservative Fre 09/20/2002, 09/21/2006, 08/26/2008, 09/07/2010, 10/27/2010, 08/02/2011, 08/31/2012, 08/20/2013, 08/21/2015  . Influenza-Unspecified 09/20/2002, 09/20/2002, 09/21/2006, 09/21/2006, 08/26/2008, 08/26/2008, 12/12/2008, 09/07/2010, 09/07/2010, 10/27/2010, 10/27/2010, 08/02/2011, 08/31/2012, 08/20/2013, 08/20/2013, 08/16/2014, 08/21/2015, 09/06/2016, 08/16/2017, 08/14/2018, 07/09/2019  . PFIZER SARS-COV-2 Vaccination 01/02/2020, 02/27/2020  . PPD Test 04/08/2008  . Pneumococcal Conjugate-13 08/16/2014, 08/16/2014  . Pneumococcal Polysaccharide-23 04/12/2003, 02/21/2008, 08/14/2018  . Pneumococcal-Unspecified 04/12/2003, 02/21/2008  . Td 11/22/1997  . Tdap 02/20/2007, 02/20/2007, 07/09/2019   Pertinent  Health Maintenance Due  Topic Date Due  .  INFLUENZA VACCINE  06/22/2020  . PNA vac Low Risk Adult  Completed   No flowsheet data found. Functional Status Survey:    Vitals:   03/31/20 1603  BP: 130/72  Pulse: 68  Resp: 16  Temp: 98 F (36.7 C)  TempSrc: Oral  SpO2: 97%  Weight: 185 lb 12.8 oz (84.3 kg)  Height: 5\' 7"  (1.702 m)   Body mass index is 29.1 kg/m. Physical Exam In general this is a pleasant elderly male in no distress   His skin is warm and dry he does have numerous seborrheic keratosis most noticeable on his back.  Eyes visual acuity appears to be intact sclera and conjunctive are clear.  Oropharynx is clear mucous membranes moist.  Chest is clear to auscultation there is no labored breathing breath sounds are somewhat shallow.  Heart is regular rate and rhythm with an occasional irregular beat he has trace lower extremity edema.  Abdomen is soft nontender with positive bowel  sounds.  Musculoskeletal does move all extremities x4 he does have a brace on his right knee-I do not really note erythema-there is some tenderness to palpation-there is some mild edema but per nursing this has improved.  Neurologic cannot really appreciate lateralizing findings his speech is clear-cranial nerves appear to be intact.  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  Says he worked in various places-and listed various places he has lived in the past   Labs.  Mar 28, 2019.  WBC 8.3 hemoglobin 12.6 platelets 292.  Sodium 139 potassium 4 BUN 17 creatinine 1.13.     Labs reviewed: No results for input(s): NA, K, CL, CO2, GLUCOSE, BUN, CREATININE, CALCIUM, MG, PHOS in the last 8760 hours. No results for input(s): AST, ALT, ALKPHOS, BILITOT, PROT, ALBUMIN in the last 8760 hours. No results for input(s): WBC, NEUTROABS, HGB, HCT, MCV, PLT in the last 8760 hours. No results found for: TSH No results found for: HGBA1C No results found for: CHOL, HDL, LDLCALC, LDLDIRECT, TRIG, CHOLHDL  Significant Diagnostic Results in last 30 days:  DG Chest 2 View  Result Date: 01/04/2013 *RADIOLOGY REPORT* Clinical Data: Hypotension CHEST - 2 VIEW Comparison: 02/10/2010 Findings: Cardiomediastinal silhouette is unremarkable.  No acute infiltrate or pleural effusion.  No pulmonary edema.  Mild elevation of the left hemidiaphragm. IMPRESSION: No active disease. Original Report Authenticated By: 02/12/2010, M.D.   CT Head Wo Contrast  Result Date: 01/04/2013 *RADIOLOGY REPORT* Clinical Data: Hypotension. CT HEAD WITHOUT CONTRAST Technique:  Contiguous axial images were obtained from the base of the skull through the vertex without contrast. Comparison: None. Findings: There is  low attenuation within the subcortical and periventricular white matter consistent with  chronic small vessel ischemic change. Several small areas of low attenuation within the right cerebellar hemisphere noted consistent with old infarcts. There is prominence of the sulci and ventricles consistent with brain atrophy.  Chronic bilateral basal ganglia calcifications noted.  There is no evidence for acute brain infarct, hemorrhage or mass.  The calvarium is intact.  The paranasal sinuses and mastoid air cells are clear. IMPRESSION: 1.  No acute intracranial abnormalities. 2.  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 Original Report Authenticated By: 01/06/2013, M.D.    Assessment/Plan    #1 history of acute right knee pain-thought secondary to gouty arthritis-he is completing a course of colchicine-this appears  to be improving he will need PT and OT for strengthening.  2.  History of atrial fibrillation he continues on Eliquis 5 mg twice daily for anticoagulation-he is not on a rate limiting agent nonetheless rate appears to be controlled.  3.  History of dementia at this point continue supportive care he does have orders for Aricept 10 mg a day Namenda 10 mg twice daily.  He was pleasant and appropriate during exam today-but does not speak a whole lot.  4.  Hypertension he is on losartan 100 mg a day-appears to be some confusion whether this was discontinued in the hospital-his blood pressures will need to be monitored it appears to be doing well today.  Hospital update BMP is pending to keep an eye on his electrolytes and renal function.  5.  History of anemia he continues on iron last hemoglobin was 12.6 update CBC is pending later this week.  6.  History COPD this is thought to be stable he does not complain of any wheezing cough or increased shortness of breath at this point will monitor.  .  #7 history of neuropathy he continues on Neurontin 300 mg twice daily this was decreased in the hospital from 3 times daily secondary apparently secondary to some sedation  concerns.  8.  History of GERD he is on Protonix 40 mg a day at this point will monitor.  9.  History of hyperlipidemia he continues on Zetia grams a day-recent LDL was 84.  10.  History of depression continues on Lexapro 10 mg a day I suspect this is complicated somewhat with his history of dementia.  11.  History of tobacco abuse-apparently he had been on nicotine Derm patch-appears this was discontinued in hospital-we will reimplement this.  .  12.  Insomnia he continues on melatonin.  [id number] note greater than 35 minutes spent assessing patient-reviewing this chart and labs-coordinating and formulating a plan of care for numerous diagnoses-of note greater than 50% of time spent coordinating plan of care with input as noted above

## 2020-04-01 ENCOUNTER — Non-Acute Institutional Stay (SKILLED_NURSING_FACILITY): Payer: Medicare Other | Admitting: Internal Medicine

## 2020-04-01 ENCOUNTER — Encounter: Payer: Self-pay | Admitting: Internal Medicine

## 2020-04-01 DIAGNOSIS — F1010 Alcohol abuse, uncomplicated: Secondary | ICD-10-CM

## 2020-04-01 DIAGNOSIS — I10 Essential (primary) hypertension: Secondary | ICD-10-CM | POA: Diagnosis not present

## 2020-04-01 DIAGNOSIS — D509 Iron deficiency anemia, unspecified: Secondary | ICD-10-CM | POA: Diagnosis not present

## 2020-04-01 DIAGNOSIS — F0390 Unspecified dementia without behavioral disturbance: Secondary | ICD-10-CM

## 2020-04-01 DIAGNOSIS — M10461 Other secondary gout, right knee: Secondary | ICD-10-CM | POA: Diagnosis not present

## 2020-04-01 DIAGNOSIS — R339 Retention of urine, unspecified: Secondary | ICD-10-CM

## 2020-04-01 DIAGNOSIS — Z66 Do not resuscitate: Secondary | ICD-10-CM

## 2020-04-01 NOTE — Progress Notes (Signed)
Provider:  Gwenith Spitz. Renato Gails, D.O., C.M.D. Location:  [facility] Room Number: 505 Place of Service:  SNF (31)  PCP: Kermit Balo, DO Patient Care Team: Kermit Balo, DO as PCP - General (Geriatric Medicine)  Extended Emergency Contact Information Primary Emergency Contact: Leland Johns Address: [address]          [place]          [place], [place] [address] Darden Amber of [place] Phone: [phone] Relation: Daughter Secondary Emergency Contact: Durward Fortes Mobile Phone: [phone] Relation: Other  Code Status: DNR Goals of Care: Advanced Directive information Advanced Directives 04/01/2020  Does Patient Have a Medical Advance Directive? Yes  Type of Advance Directive Out of facility DNR (pink MOST or yellow form)  Does patient want to make changes to medical advance directive? No - Patient declined  Pre-existing out of facility DNR order (yellow form or pink MOST form) Pink MOST/Yellow Form most recent copy in chart - Physician notified to receive inpatient order   Chief Complaint  Patient presents with  . New Admit To SNF    New Admission to SNF    HPI: Patient is a 79 y.o. male seen today for admission to [facility] for rehab s/p due to right knee gout flare.  He was in [facility] with right knee effusion, pain, erythema, warmth.  Arthrocentesis confirmed acute gouty crystals.  He was treated with indomethacin at the hospital and was to be continued on it here.  He was also started on colchicine which I did continue.  He has a h/o alcoholism and afib so high risk for GI bleed.  He has a h/o alzheimer's and vascular dementia, paroxysmal afib, htn, hyperlipidemia, compression fractures, alcoholism, osteoporosis, COPD, lumbar DDD, neurogenic bladder with retention and prediabetes.  He performed I/O caths at home.  He now has a foley catheter in place.  He is here for PT, OT due to his knee and ST due to cognitive dysfunction.   He's on a regular diet.  When seen, his knee pain was much improved.  He had very little discomfort on exam, the effusion had gotten better, there was no erythema.  He is eager to get home.    Past Medical History:  Diagnosis Date  . Chronic back pain   . COPD (chronic obstructive pulmonary disease) ([facility])   . H/O bladder problems   . History of rib fracture   . Hypertension   . Kidney stones   . Self-catheterizes urinary bladder    self caths   Past Surgical History:  Procedure Laterality Date  . bladder problem surgery      Social History   Socioeconomic History  . Marital status: Widowed    Spouse name: Not on file  . Number of children: Not on file  . Years of education: Not on file  . Highest education level: Not on file  Occupational History  . Not on file  Tobacco Use  . Smoking status: Current Every Day Smoker    Packs/day: 1.50    Types: Cigarettes  . Smokeless tobacco: Never Used  Substance and Sexual Activity  . Alcohol use: Yes    Comment: drinks several alcoholic drinks at night  . Drug use: No  . Sexual activity: Not on file  Other Topics Concern  . Not on file  Social History Narrative  . Not on file   Social Determinants of Health   Financial  Resource Strain:   . Difficulty of Paying Living Expenses:   Food Insecurity:   . Worried About Charity fundraiser in the Last Year:   . Arboriculturist in the Last Year:   Transportation Needs:   . Film/video editor (Medical):   Marland Kitchen Lack of Transportation (Non-Medical):   Physical Activity:   . Days of Exercise per Week:   . Minutes of Exercise per Session:   Stress:   . Feeling of Stress :   Social Connections:   . Frequency of Communication with Friends and Family:   . Frequency of Social Gatherings with Friends and Family:   . Attends Religious Services:   . Active Member of Clubs or Organizations:   . Attends Archivist Meetings:   Marland Kitchen Marital Status:     reports that he has  been smoking cigarettes. He has been smoking about 1.50 packs per day. He has never used smokeless tobacco. He reports current alcohol use. He reports that he does not use drugs.  Functional Status Survey:    History reviewed. No pertinent family history.  Health Maintenance  Topic Date Due  . INFLUENZA VACCINE  06/22/2020  . TETANUS/TDAP  07/08/2029  . COVID-19 Vaccine  Completed  . PNA vac Low Risk Adult  Completed    Allergies  Allergen Reactions  . Sulfa Antibiotics Other (See Comments) and Cough    Low blood pressure Respiratory depression      . Ace Inhibitors Other (See Comments) and Cough    cough   . Other Other (See Comments)    Risk of hepatotoxicity..the patient abuses alcohol ( 01/25/2013 )    . 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 Sulfamethoxazole-Trimethoprim Cough  . Levofloxacin Cough    Outpatient Encounter Medications as of 04/01/2020  Medication Sig  . apixaban (ELIQUIS) 5 MG TABS tablet Take 5 mg by mouth 2 (two) times daily.  . Cholecalciferol (VITAMIN D-3) 25 MCG (1000 UT) CAPS Take 1 capsule by mouth daily. For Osteoporosis  . donepezil (ARICEPT) 10 MG tablet Take 10 mg by mouth every evening. For Alzheimer's  . escitalopram (LEXAPRO) 10 MG tablet Take 10 mg by mouth daily.  Marland Kitchen ezetimibe (ZETIA) 10 MG tablet Take 10 mg by mouth daily. For hyperlipidemia  . gabapentin (NEURONTIN) 300 MG capsule Take 300 mg by mouth 2 (two) times daily.  . iron polysaccharides (NIFEREX) 150 MG capsule Take 150 mg by mouth daily.  . memantine (NAMENDA) 10 MG tablet Take 10 mg by mouth 2 (two) times daily.  . NON FORMULARY DIET:Regular diet Regular texture  . pantoprazole (PROTONIX) 20 MG tablet Take 20 mg by mouth daily.  . Polysacchar Iron-FA-B12 (Elizabeth 150 FORTE) 150-1-25 MG-MG-MCG CAPS Take by mouth.  . colchicine 0.6 MG tablet Take 0.6 mg by mouth 2 (two) times daily.  .  [DISCONTINUED] Melatonin 10 MG TABS Take 1 tablet by mouth at bedtime.  . [DISCONTINUED] nicotine (NICODERM CQ) 21 mg/24hr patch Place 1 patch (21 mg total) onto the skin daily. For 21 mg 6 weeks, 14 mg for 2 weeks, 7 mg for 2 weeks   No facility-administered encounter medications on file as of 04/01/2020.    Review of Systems  Constitutional: Negative for chills, fever and malaise/fatigue.  Eyes:       Glasses  Respiratory: Negative for shortness of breath.   Cardiovascular: Negative for chest pain, palpitations  and leg swelling.  Gastrointestinal: Negative for abdominal pain, blood in stool, constipation, diarrhea, melena, nausea and vomiting.  Genitourinary: Negative for dysuria.       Urinary retention, does I/O caths at home  Musculoskeletal: Positive for back pain, falls and joint pain.  Skin: Negative for itching and rash.  Neurological: Negative for dizziness and loss of consciousness.  Endo/Heme/Allergies: Bruises/bleeds easily.  Psychiatric/Behavioral: Positive for memory loss. Negative for depression. The patient has insomnia. The patient is not nervous/anxious.        Alcohol abuse    Vitals:   04/01/20 1431  BP: 135/71  Pulse: 72  Temp: 97.6 F (36.4 C)  Weight: 185 lb 12.8 oz (84.3 kg)  Height: 5\' 7"  (1.702 m)   Body mass index is 29.1 kg/m. Physical Exam Constitutional:      Appearance: He is not toxic-appearing.     Comments: Disheveled appearance, NAD  HENT:     Head: Normocephalic and atraumatic.     Right Ear: External ear normal.     Left Ear: External ear normal.     Nose: Nose normal.     Mouth/Throat:     Pharynx: Oropharynx is clear.  Eyes:     General: No scleral icterus.    Extraocular Movements: Extraocular movements intact.     Conjunctiva/sclera: Conjunctivae normal.     Pupils: Pupils are equal, round, and reactive to light.     Comments: glasses  Cardiovascular:     Rate and Rhythm: Normal rate and regular rhythm.     Pulses: Normal  pulses.     Heart sounds: Normal heart sounds.  Pulmonary:     Effort: Pulmonary effort is normal.     Breath sounds: Normal breath sounds. No wheezing, rhonchi or rales.  Abdominal:     General: Bowel sounds are normal. There is no distension.     Palpations: Abdomen is soft.     Tenderness: There is no abdominal tenderness.  Musculoskeletal:        General: Normal range of motion.     Right lower leg: No edema.     Left lower leg: No edema.  Skin:    General: Skin is warm and dry.     Comments: No erythema over knee, mild residual swelling, using walker  Neurological:     Mental Status: He is alert. Mental status is at baseline.     Gait: Gait abnormal.     Comments: Uses rolling walker  Psychiatric:        Mood and Affect: Mood normal.    Labs reviewed: Reviewed ED notes in care everywhwere  Imaging and Procedures obtained prior to SNF admission: DG Chest 2 View  Result Date: 01/04/2013 *RADIOLOGY REPORT* Clinical Data: Hypotension CHEST - 2 VIEW Comparison: 02/10/2010 Findings: Cardiomediastinal silhouette is unremarkable.  No acute infiltrate or pleural effusion.  No pulmonary edema.  Mild elevation of the left hemidiaphragm. IMPRESSION: No active disease. Original Report Authenticated By: 02/12/2010, M.D.   CT Head Wo Contrast  Result Date: 01/04/2013 *RADIOLOGY REPORT* Clinical Data: Hypotension. CT HEAD WITHOUT CONTRAST Technique:  Contiguous axial images were obtained from the base of the skull through the vertex without contrast. Comparison: None. Findings: There is  low attenuation within the subcortical and periventricular white matter consistent with chronic small vessel ischemic change. Several small areas of low attenuation within the right cerebellar hemisphere noted consistent with old infarcts. There is prominence of the sulci and ventricles consistent with brain atrophy.  Chronic bilateral basal ganglia calcifications noted.  There is no evidence for acute brain  infarct, hemorrhage or mass.  The calvarium is intact.  The paranasal sinuses and mastoid air cells are clear. IMPRESSION: 1.  No acute intracranial abnormalities. 2.  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 Original Report Authenticated By: Signa Kell, M.D.    Assessment/Plan 1. Other secondary acute gout of right knee -improving -I opted to avoid the nsaid with his iron deficiency and alcohol abuse history   2. Dementia without behavioral disturbance, unspecified dementia type ([facility]) -I'm a bit concerned about his I/O caths at home being under hygienic circumstances in view of his cognitive decline and alcohol intake -monitor functional ability here and see how ST feels he's doing in this regard  3. Iron deficiency anemia, unspecified iron deficiency anemia type -f/u cbc in am after nsaid use  4. Essential hypertension -bp at goal with current therapy, cont same regimen and monitor--bmp in am  5. Alcohol abuse -history of this, likely contributor to dementia, as well  6. Urinary retention -has foley now and for voiding trial again and then staff to watch him with I/O caths and be sure he's safe to do this on his own at home (? Due to dementia)  Family/ staff Communication: discussed with ADON  Labs/tests ordered:  Cbc, bmp 5/12  Sarahmarie Leavey L. Mable Dara, D.O. Geriatrics [facility] [address] [place], [place] [address] Cell Phone (Mon-Fri 8am-5pm):  [phone] On Call:  [phone] & follow prompts after 5pm & weekends Office Phone:  [phone] Office Fax:  [phone]

## 2020-04-02 ENCOUNTER — Non-Acute Institutional Stay (SKILLED_NURSING_FACILITY): Payer: Medicare Other | Admitting: Internal Medicine

## 2020-04-02 DIAGNOSIS — M109 Gout, unspecified: Secondary | ICD-10-CM | POA: Diagnosis not present

## 2020-04-02 DIAGNOSIS — N289 Disorder of kidney and ureter, unspecified: Secondary | ICD-10-CM | POA: Diagnosis not present

## 2020-04-02 NOTE — Progress Notes (Signed)
This is an acute visit.  Level care scale.  Facility is [facility].  Chief complaint acute visit follow-up renal insufficiency.  History of present illness.  Patient is a pleasant 79 year old male he is here for short-term rehab after hospitalization for gouty arthritis-this responded to an aspiration of his right knee as well as a short course of colchicine which he is completed.  He appears to be doing well in this regard with less pain and gaining some strength.  We did order labs which shows his creatinine has risen a bit above what it was last week creatinine is 1.48Had been 1.13 back on May 6.  I did talk to him about making sure he drinks his fluids and he expressed understanding of this-he is not on any diuretics I do note he is on losartan-somewhat unclear whether this was meant to be discontinued or not.  But we will need to update his renal function and keep an eye on this.  Currently he is sitting in his chair comfortably has no complaints-   Past Medical History:  Diagnosis Date  . Chronic back pain   . COPD (chronic obstructive pulmonary disease) (HCC)   . H/O bladder problems   . History of rib fracture   . Hypertension   . Kidney stones   . Self-catheterizes urinary bladder    self caths        Past Surgical History:  Procedure Laterality Date  . bladder problem surgery           Allergies  Allergen Reactions  . Sulfa Antibiotics Other (See Comments) and Cough    Low blood pressure Respiratory depression      . Ace Inhibitors Other (See Comments) and Cough    cough   . Other Other (See Comments)    Risk of hepatotoxicity..the patient abuses alcohol ( 01/25/2013 )    . 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 Sulfamethoxazole-Trimethoprim Cough  . Levofloxacin Cough         Allergies as of 03/31/2020      Reactions   Sulfa  Antibiotics Other (See Comments), Cough   Low blood pressure Respiratory depression   Ace Inhibitors Other (See Comments), Cough   cough   Other Other (See Comments)   Risk of hepatotoxicity..the patient abuses alcohol ( 01/25/2013 )   Statins Other (See Comments), Cough   Risk of hepatotoxicity..the patient abuses alcohol ( 01/25/2013 )   Sulfamethoxazole-trimethoprim Cough   Levofloxacin Cough         Medication List                          colchicine 0.6 MG tablet Take 0.6 mg by mouth 2 (two) times daily.   donepezil 10 MG tablet Commonly known as: ARICEPT Take 10 mg by mouth every evening. For Alzheimer's   Eliquis 5 MG Tabs tablet Generic drug: apixaban Take 5 mg by mouth 2 (two) times daily.   escitalopram 10 MG tablet Commonly known as: LEXAPRO Take 10 mg by mouth daily.   ezetimibe 10 MG tablet Commonly known as: ZETIA Take 10 mg by mouth daily. For hyperlipidemia   gabapentin 300 MG capsule Commonly known as: NEURONTIN Take 300 mg by mouth 2 (two) times daily.   iron polysaccharides 150 MG capsule Commonly known as: NIFEREX Take 150 mg by mouth daily.   Melatonin 10  MG Tabs Take 1 tablet by mouth at bedtime.   memantine 10 MG tablet Commonly known as: NAMENDA Take 10 mg by mouth 2 (two) times daily.   nicotine 21 mg/24hr patch Commonly known as: Nicoderm CQ Place 1 patch (21 mg total) onto the skin daily. For 21 mg 6 weeks, 14 mg for 2 weeks, 7 mg for 2 weeks   NON FORMULARY DIET:Regular diet Regular texture   pantoprazole 20 MG tablet Commonly known as: PROTONIX Take 20 mg by mouth daily.   Vitamin D-3 25 MCG (1000 UT) Caps Take 1 capsule by mouth daily. For Osteoporosis     Of note is on Losartan 100 mg p.o. daily    Review of systems.  In general is not complaining of any fever or chills.  Skin does not complain of rashes itching or diaphoresis.  Head ears eyes nose mouth and throat does  not complain of visual changes or sore throat.  Respiratory is not complain of being short of breath or having a cough.  Cardiac does not complain of chest pain.  GI is not complaining of abdominal discomfort nausea vomiting diarrhea constipation.  GU does have an indwelling Foley catheter does not complain of dysuria.  Musculoskeletal is not really complaining of knee pain today does have a history of gouty arthritis right knee.  Neurologic does not complain of dizziness headache numbness or syncope.  And psych does have a history of dementia does not complain of being depressed or anxious-apparently at times does not want his vital signs taken.  Physical exam.  He is afebrile pulse of 70 respirations of 18 blood pressures that I been able to obtain appear to be more systolically in the 093O.  In general this is a pleasant elderly male in no distress sitting comfortably in his chair.  His skin is warm and dry he does have numerous seborrheic keratosis.  Eyes visual acuity appears to be intact sclera and conjunctive are clear.  Chest is clear to auscultation there is no labored breathing.  Heart is regular rate and rhythm without murmur gallop or rub he has minimal lower extremity edema.  Abdomen soft nontender with positive bowel sounds.  GU does continue with indwelling Foley catheter draining amber-colored urine.  Musculoskeletal continues with a flexible brace on his right knee-I do not really see concerning erythema or acute pain or increasing edema.  Neurologic appears grossly intact cannot appreciate lateralizing findings speech is clear.  Psych he is pleasant and appropriate.  Labs.  Apr 02, 2020.  WBC 7.5 hemoglobin 13.1 platelets 359.  Sodium 140 potassium 4.2 creatinine 1.48 BUN 26.2.  Mar 28, 2019.  WBC 8.3 hemoglobin 12.6 platelets 292.  Sodium 139 potassium 4 BUN 17 creatinine 1.13.  Assessment and plan.  1.  Mild renal insufficiency-creatinine  appears to have gone up a bit from his hospital discharge lab value with a creatinine of 1.48 BUN is 26.2.  I have spoken to him about drinking his fluids he expressed understanding-he is not on any diuretics I do note he is on losartan-will update a metabolic panel later this week to see where we stand.  Clinically appears to be doing well.  2.  History of gouty arthritis-this appears to be stable he has completed a course of colchicine-pain appears to be controlled. .  [id number]

## 2020-04-04 ENCOUNTER — Encounter: Payer: Self-pay | Admitting: Internal Medicine

## 2020-04-09 DIAGNOSIS — F1010 Alcohol abuse, uncomplicated: Secondary | ICD-10-CM | POA: Insufficient documentation

## 2020-04-09 DIAGNOSIS — M109 Gout, unspecified: Secondary | ICD-10-CM | POA: Insufficient documentation

## 2020-04-09 DIAGNOSIS — Z7409 Other reduced mobility: Secondary | ICD-10-CM | POA: Insufficient documentation

## 2020-04-09 DIAGNOSIS — R339 Retention of urine, unspecified: Secondary | ICD-10-CM | POA: Insufficient documentation

## 2020-04-09 DIAGNOSIS — F0390 Unspecified dementia without behavioral disturbance: Secondary | ICD-10-CM | POA: Insufficient documentation

## 2020-04-09 DIAGNOSIS — Z66 Do not resuscitate: Secondary | ICD-10-CM | POA: Insufficient documentation

## 2020-04-09 DIAGNOSIS — I10 Essential (primary) hypertension: Secondary | ICD-10-CM | POA: Insufficient documentation

## 2020-04-09 DIAGNOSIS — D509 Iron deficiency anemia, unspecified: Secondary | ICD-10-CM | POA: Insufficient documentation

## 2020-04-09 NOTE — Addendum Note (Signed)
Addended by: Kermit Balo on: 04/09/2020 09:15 AM   Modules accepted: Orders

## 2020-04-10 ENCOUNTER — Non-Acute Institutional Stay (SKILLED_NURSING_FACILITY): Payer: Medicare Other | Admitting: Internal Medicine

## 2020-04-10 ENCOUNTER — Encounter: Payer: Self-pay | Admitting: Internal Medicine

## 2020-04-10 DIAGNOSIS — I10 Essential (primary) hypertension: Secondary | ICD-10-CM

## 2020-04-10 DIAGNOSIS — D509 Iron deficiency anemia, unspecified: Secondary | ICD-10-CM

## 2020-04-10 DIAGNOSIS — I480 Paroxysmal atrial fibrillation: Secondary | ICD-10-CM

## 2020-04-10 DIAGNOSIS — N183 Chronic kidney disease, stage 3 unspecified: Secondary | ICD-10-CM

## 2020-04-10 DIAGNOSIS — M10461 Other secondary gout, right knee: Secondary | ICD-10-CM | POA: Diagnosis not present

## 2020-04-10 DIAGNOSIS — R339 Retention of urine, unspecified: Secondary | ICD-10-CM

## 2020-04-10 NOTE — Progress Notes (Signed)
Location:    [facility] Room Number: 114/P Place of Service:  SNF [phone]  Provider: Estill BattenLassen,Windy Dudek, PA-C  PCP: Malka SoJobe, Daniel B., MD Patient Care Team: Malka SoJobe, Daniel B., MD as PCP - General (Internal Medicine)  Extended Emergency Contact Information Primary Emergency Contact: Leland JohnsMackenzie, Carrie Address: [address]          [address]          [place], [place] [id number] [place] Mobile Phone: [phone] Relation: Daughter Secondary Emergency Contact: Durward FortesBritton, Todd Mobile Phone: [phone] Relation: Other  Code Status: DNR Goals of care:  Advanced Directive information Advanced Directives 04/10/2020  Does Patient Have a Medical Advance Directive? Yes  Type of Advance Directive Out of facility DNR (pink MOST or yellow form)  Does patient want to make changes to medical advance directive? No - Patient declined  Pre-existing out of facility DNR order (yellow form or pink MOST form) -     Allergies  Allergen Reactions  . Sulfa Antibiotics Other (See Comments) and Cough    Low blood pressure Respiratory depression      . Ace Inhibitors Other (See Comments) and Cough    cough   . Other Other (See Comments)    Risk of hepatotoxicity..the patient abuses alcohol ( 01/25/2013 )    . 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 Sulfamethoxazole-Trimethoprim Cough  . Levofloxacin Cough    Chief Complaint  Patient presents with  . Discharge Note    Discharge Visit    HPI:  79 y.o. male seen today for discharge slated for Saturday, Apr 12, 2020.  He has been here for short-term rehab after presenting to the hospital with right knee pain-.  This was found to be most likely gouty arthritis-his knee was aspirated and he was given a course of colchicine.  This appears to be significantly improved-he is walking with therapy and says he is having significantly less  discomfort.  He does have a history of dementia which appears to be fairly mild he is followed by neurology and continues on Namenda 10 mg twice daily and Aricept 10 mg a day.  Nursing is not really noted any behaviors-initially when he came here he was somewhat hesitant to get his vital signs taken regularly but I do not believe this has been an issue recently.  He does have a history of atrial fibrillation and continues on Eliquis 5 mg twice daily he is not on a rate limiting agent nonetheless rate appears to be controlled.  He also has a history of hypertension and continues on Cozaar 100 mg a day.-Blood pressure today is stable at 120/65.    At 1 point his creatinine did rise a bit up to 1.48 but we have done a subsequent lab and on May 14 so it was back down to 1.12-we did encourage him to drink his fluids.  He also has a history of tobacco abuse-he did request a nicotine patch and that has been started.  He also has a history of urinary retention-has an indwelling Foley catheter-apparently he self caths at home and staff will do a voiding trial before he goes home-he also would get nursing support when he goes home.  He appears quite confident he can self cath since he has done this previously.  Currently has no complaints he was actually working with therapy when I entered the room-he  is able to use his walker he says his pain is under better control and is very motivated to go home.    He also has a history of anemia his last hemoglobin actually was 13.1 on May 12-he is on iron.          Past Medical History:  Diagnosis Date  . Chronic back pain   . COPD (chronic obstructive pulmonary disease) (HCC)   . H/O bladder problems   . History of rib fracture   . Hypertension   . Kidney stones   . Self-catheterizes urinary bladder    self caths    Past Surgical History:  Procedure Laterality Date  . bladder problem surgery        reports that he has been smoking  cigarettes. He has been smoking about 1.50 packs per day. He has never used smokeless tobacco. He reports current alcohol use. He reports that he does not use drugs. Social History   Socioeconomic History  . Marital status: Widowed    Spouse name: Not on file  . Number of children: Not on file  . Years of education: Not on file  . Highest education level: Not on file  Occupational History  . Not on file  Tobacco Use  . Smoking status: Current Every Day Smoker    Packs/day: 1.50    Types: Cigarettes  . Smokeless tobacco: Never Used  Substance and Sexual Activity  . Alcohol use: Yes    Comment: drinks several alcoholic drinks at night  . Drug use: No  . Sexual activity: Not on file  Other Topics Concern  . Not on file  Social History Narrative  . Not on file   Social Determinants of Health   Financial Resource Strain:   . Difficulty of Paying Living Expenses:   Food Insecurity:   . Worried About Programme researcher, broadcasting/film/video in the Last Year:   . Barista in the Last Year:   Transportation Needs:   . Freight forwarder (Medical):   Marland Kitchen Lack of Transportation (Non-Medical):   Physical Activity:   . Days of Exercise per Week:   . Minutes of Exercise per Session:   Stress:   . Feeling of Stress :   Social Connections:   . Frequency of Communication with Friends and Family:   . Frequency of Social Gatherings with Friends and Family:   . Attends Religious Services:   . Active Member of Clubs or Organizations:   . Attends Banker Meetings:   Marland Kitchen Marital Status:   Intimate Partner Violence:   . Fear of Current or Ex-Partner:   . Emotionally Abused:   Marland Kitchen Physically Abused:   . Sexually Abused:    Functional Status Survey:    Allergies  Allergen Reactions  . Sulfa Antibiotics Other (See Comments) and Cough    Low blood pressure Respiratory depression      . Ace Inhibitors Other (See Comments) and Cough    cough   . Other Other (See Comments)    Risk  of hepatotoxicity..the patient abuses alcohol ( 01/25/2013 )    . 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 Sulfamethoxazole-Trimethoprim Cough  . Levofloxacin Cough    Pertinent  Health Maintenance Due  Topic Date Due  . INFLUENZA VACCINE  06/22/2020  . PNA vac Low Risk Adult  Completed    Medications: Allergies as of 04/10/2020  Reactions   Sulfa Antibiotics Other (See Comments), Cough   Low blood pressure Respiratory depression   Ace Inhibitors Other (See Comments), Cough   cough   Other Other (See Comments)   Risk of hepatotoxicity..the patient abuses alcohol ( 01/25/2013 )   Statins Other (See Comments), Cough   Risk of hepatotoxicity..the patient abuses alcohol ( 01/25/2013 )   Sulfamethoxazole-trimethoprim Cough   Levofloxacin Cough      Medication List       Accurate as of Apr 10, 2020  4:04 PM. If you have any questions, ask your nurse or doctor.        STOP taking these medications   colchicine 0.6 MG tablet Stopped by: Edmon Crape, PA-C     TAKE these medications   donepezil 10 MG tablet Commonly known as: ARICEPT Take 10 mg by mouth every evening. For Alzheimer's   Eliquis 5 MG Tabs tablet Generic drug: apixaban Take 5 mg by mouth 2 (two) times daily.   escitalopram 10 MG tablet Commonly known as: LEXAPRO Take 10 mg by mouth daily.   ezetimibe 10 MG tablet Commonly known as: ZETIA Take 10 mg by mouth daily. For hyperlipidemia   FERREX 150 PO Take 150 capsules by mouth daily. FOR IRON DEFICIENCY (DO NOT CRUSH)   gabapentin 300 MG capsule Commonly known as: NEURONTIN Take 300 mg by mouth 2 (two) times daily.   LOSARTAN POTASSIUM PO Take 100 mg by mouth daily. For HTN   melatonin 3 MG Tabs tablet Take 3 mg by mouth at bedtime. For insomnia   memantine 10 MG tablet Commonly known as: NAMENDA Take 10 mg by mouth 2 (two) times daily.   multivitamin tablet Take  1 tablet by mouth daily.   nicotine 21 mg/24hr patch Commonly known as: NICODERM CQ - dosed in mg/24 hours Place 21 mg onto the skin daily.   NON FORMULARY DIET:Regular diet Regular texture   Protonix 40 MG tablet Generic drug: pantoprazole Take 40 mg by mouth. Give one tablet by mouth before largest meal GERD What changed: Another medication with the same name was removed. Continue taking this medication, and follow the directions you see here. Changed by: Edmon Crape, PA-C   Vitamin D-3 25 MCG (1000 UT) Caps Take 1 capsule by mouth daily. For Osteoporosis       Review of Systems   In general is not complaining of any fever or chills he is looking forward to going home.  Skin does not complain of rashes or itching.  Head ears eyes nose mouth and throat is not complain of visual changes or sore throat.  Respiratory does not complain of being short of breath or having a cough.  Cardiac no complaints of chest pain or increasing edema.  GI is not complaining of abdominal pain nausea vomiting diarrhea constipation.  GU does have an indwelling Foley catheter-does not complain of dysuria-again he prefers to self cath and will do a voiding trial.  Musculoskeletal does not complain of acute joint pain says his knee pain has improved significantly he is able to walk with therapy.  Neurologic does not complain of dizziness headache numbness still has some continued weakness.  And psych does have a listed history of dementia this appears to be fairly mild--- nursing has not really noted any behaviors he is pleasant and appropriate    Vitals:   04/10/20 1541  BP: 120/65  Pulse: 66  Resp: 18  Temp: 97.7 F (36.5 C)  TempSrc: Oral  SpO2: 97%  Weight: 184 lb 9.6 oz (83.7 kg)  Height: 5\' 7"  (1.702 m)   Body mass index is 28.91 kg/m. Physical Exam  In general this is a pleasant elderly male in no distress.  His skin is warm and dry he does have scattered numerous  seborrheic keratosis.  Eyes visual acuity appears to be intact sclera and conjunctive are clear.  Oropharynx clear mucous membranes moist.  Chest is clear to auscultation without labored breathing.  Heart is regular rate and rhythm without murmur gallop or rub he has trace lower extremity edema.  Abdomen is soft nontender with positive bowel sounds.  GU has an indwelling Foley catheter draining amber-colored urine  Musculoskeletal is able to stand and walk he uses a walker and is working with therapy today-he does have a brace on his right knee area-I do not really see evidence of concerning edema or erythema  minimal tenderness to palpation today.   Neurologic is grossly intact cannot appreciate lateralizing findings his speech is clear.  Psych he is pleasant and appropriate is speaking more than when I first saw him.  He is looking forward very much to going home    Labs reviewed:  Apr 04, 2020.  Sodium 137 potassium 4.1 BUN 23.2 creatinine 1.12.  Apr 02, 2020.  WBC 7.5 hemoglobin 13.1 platelets 359.  Sodium 140 potassium 4.2 BUN 26.2 creatinine 1.48.   Basic Metabolic Panel: No results for input(s): NA, K, CL, CO2, GLUCOSE, BUN, CREATININE, CALCIUM, MG, PHOS in the last 8760 hours. Liver Function Tests: No results for input(s): AST, ALT, ALKPHOS, BILITOT, PROT, ALBUMIN in the last 8760 hours. No results for input(s): LIPASE, AMYLASE in the last 8760 hours. No results for input(s): AMMONIA in the last 8760 hours. CBC: No results for input(s): WBC, NEUTROABS, HGB, HCT, MCV, PLT in the last 8760 hours. Cardiac Enzymes: No results for input(s): CKTOTAL, CKMB, CKMBINDEX, TROPONINI in the last 8760 hours. BNP: Invalid input(s): POCBNP CBG: No results for input(s): GLUCAP in the last 8760 hours.  Procedures and Imaging Studies During Stay: No results found.  Assessment/Plan:    #1 history of right knee pain-this has improved significantly as-this was thought to  be gouty arthritis and he did receive an aspiration in the hospital and a short course of colchicine.  This continues to improve he is working with therapy will need continued PT and OT as well as nursing support at home.  2.  History of dementia this appears to be mild he has been followed by neurology he is on Namenda 10 mg twice daily and Aricept 10 mg daily.  Nursing has not really noted any behaviors it appears has not really been an issue during his stay here.  3.  History of anemia-he is on iron-- hemoglobin appears quite stable at 13.1 will warrant follow-up by primary care provider.  4.  Hypertension this appears to be stable on Cozaar 100 mg a day.  5.  History of stage III chronic kidney disease- creatinine at one-point rose to 1.48 however without any intervention it came back to 1.12 on most recent lab-we did encourage fluids-will need follow-up by primary care provider.  6.  History of urinary retention he does have an indwelling Foley catheter-he self caths at home-as noted above will attempt a voiding trial in facility before he goes home and if successful I suspect he can self cath-nursing also will be following him when he goes home.  He very much wants to self cath.  7.  History of depression this appears stable he is on Lexapro 10 mg a day.  8.  History of atrial fibrillation he is on Eliquis 5 mg twice daily for anticoagulation-he is not on a rate limiting agent but his rate appears to be controlled.  9.  History of tobacco abuse as noted above he has been started on a nicotine patch.  10.  History of neuropathy continues on Neurontin 300 mg twice daily.  11.  History of hyperlipidemia he continues on Zetia since he is a short-term patient will defer to primary care provider for follow-up.  Clinically appears to have improved-he is very motivated to go home-he will need continued PT and OT for strengthening as well as nursing support.-As well as expedient primary care  follow-up- is also being followed by neurology  He will be going to [facility] in [place].   [id number] note greater than 30 minutes spent on this discharge summary-greater than 50% of time spent coordinating and formulating a plan of care for numerous diagnoses

## 2020-04-11 ENCOUNTER — Encounter: Payer: Self-pay | Admitting: Internal Medicine

## 2020-04-11 MED ORDER — APIXABAN 5 MG PO TABS: 5.0000 mg | ORAL_TABLET | Freq: Two times a day (BID) | ORAL | 0 refills | Status: AC

## 2020-04-11 MED ORDER — DONEPEZIL HCL 10 MG PO TABS: 10.0000 mg | ORAL_TABLET | Freq: Every evening | ORAL | 0 refills | Status: AC

## 2020-04-11 MED ORDER — POLYSACCHARIDE IRON COMPLEX 150 MG PO CAPS
150.0000 mg | ORAL_CAPSULE | Freq: Every day | ORAL | 0 refills | Status: AC
Start: 2020-04-11 — End: ?

## 2020-04-11 MED ORDER — EZETIMIBE 10 MG PO TABS: 10.0000 mg | ORAL_TABLET | Freq: Every day | ORAL | 0 refills | Status: AC

## 2020-04-11 MED ORDER — NICOTINE 21 MG/24HR TD PT24: 21.0000 mg | MEDICATED_PATCH | Freq: Every day | TRANSDERMAL | 0 refills | Status: AC

## 2020-04-11 MED ORDER — LOSARTAN POTASSIUM 100 MG PO TABS: 100.0000 mg | ORAL_TABLET | Freq: Every day | ORAL | 0 refills | Status: AC

## 2020-04-11 MED ORDER — PANTOPRAZOLE SODIUM 40 MG PO TBEC: 40.0000 mg | DELAYED_RELEASE_TABLET | Freq: Every day | ORAL | 0 refills | Status: AC

## 2020-04-11 MED ORDER — ESCITALOPRAM OXALATE 10 MG PO TABS: 10.0000 mg | ORAL_TABLET | Freq: Every day | ORAL | 0 refills | Status: AC

## 2020-04-11 MED ORDER — GABAPENTIN 300 MG PO CAPS: 300.0000 mg | ORAL_CAPSULE | Freq: Two times a day (BID) | ORAL | 0 refills | Status: AC

## 2020-04-11 MED ORDER — MEMANTINE HCL 10 MG PO TABS: 10.0000 mg | ORAL_TABLET | Freq: Two times a day (BID) | ORAL | 0 refills | Status: AC

## 2020-05-05 ENCOUNTER — Other Ambulatory Visit: Payer: Self-pay | Admitting: Internal Medicine

## 2020-12-16 ENCOUNTER — Encounter (HOSPITAL_BASED_OUTPATIENT_CLINIC_OR_DEPARTMENT_OTHER): Payer: Self-pay

## 2020-12-16 ENCOUNTER — Emergency Department (HOSPITAL_BASED_OUTPATIENT_CLINIC_OR_DEPARTMENT_OTHER)
Admission: EM | Admit: 2020-12-16 | Discharge: 2020-12-16 | Disposition: A | Discharge status: Home / Self Care | Payer: Medicare Other | Attending: Emergency Medicine | Admitting: Emergency Medicine

## 2020-12-16 DIAGNOSIS — S41111A Laceration without foreign body of right upper arm, initial encounter: Secondary | ICD-10-CM | POA: Insufficient documentation

## 2020-12-16 DIAGNOSIS — Z5321 Procedure and treatment not carried out due to patient leaving prior to being seen by health care provider: Secondary | ICD-10-CM | POA: Diagnosis not present

## 2020-12-16 DIAGNOSIS — W228XXA Striking against or struck by other objects, initial encounter: Secondary | ICD-10-CM | POA: Diagnosis not present

## 2020-12-16 DIAGNOSIS — S4991XA Unspecified injury of right shoulder and upper arm, initial encounter: Secondary | ICD-10-CM | POA: Diagnosis present

## 2020-12-16 NOTE — ED Notes (Signed)
Removed pt bandage as he states it has been on since Thursday. Minimal discharge noted on gauze, however started bleeding when removed as scab was adhered to gauze. Nonadherent gauze applied and wrapped with gauze to prevent tape being on skin.

## 2020-12-16 NOTE — ED Triage Notes (Signed)
Pt states 5 days ago hit right arm against end table, got a skin tear. States whenever he takes off the bandage it starts to bleed. On Eliquis. Bleeding controlled during triage.
